# Patient Record
Sex: Female | Born: 2005 | Race: Black or African American | Hispanic: No | Marital: Single
Health system: Southern US, Community
[De-identification: ages and names within clinical notes are randomized; demographics above are authoritative.]

## PROBLEM LIST (undated history)

## (undated) DIAGNOSIS — F909 Attention-deficit hyperactivity disorder, unspecified type: Secondary | ICD-10-CM

---
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
## 2024-01-01 ENCOUNTER — Ambulatory Visit (HOSPITAL_COMMUNITY)
Admission: RE | Admit: 2024-01-01 | Discharge: 2024-01-01 | Disposition: A | Discharge status: Home / Self Care | Source: Ambulatory Visit | Attending: Obstetrics and Gynecology | Admitting: Obstetrics and Gynecology

## 2024-01-01 DIAGNOSIS — N914 Secondary oligomenorrhea: Secondary | ICD-10-CM | POA: Diagnosis present

## 2024-01-07 ENCOUNTER — Ambulatory Visit: Payer: Self-pay | Admitting: Obstetrics and Gynecology

## 2024-01-10 NOTE — Telephone Encounter (Addendum)
 Called patient regarding recent US  results. Patient OK to have mom on call during results. Notified patient per physician, US  showed normal uterus and normal lining. Advised patient to take medication Provera if menses has not started. Patient reports having cycles every other month which was discussed during visit, if menses is spaced further out than every other month, patient can take provera medication to help menses. Patient reports LMP 12/02/23 and is suppose to have next cycle in 01/2024. Notify patient medication will be sent to pharmacy for patient if cycle does not occur in July. Preferred pharmacy  [facility] on [address], RN  ----- Message from Carter Quarry sent at 01/07/2024  2:17 PM EDT ----- Notify that ultrasound shows normal uterus with normal lining and ovaries. If menses have not returned, can use provera to help start it ----- Message ----- From: Interface, Rad Results In Sent: 01/01/2024   2:25 PM EDT To: Carter Quarry, MD

## 2024-02-23 DIAGNOSIS — L03031 Cellulitis of right toe: Secondary | ICD-10-CM | POA: Insufficient documentation

## 2024-02-29 ENCOUNTER — Ambulatory Visit (INDEPENDENT_AMBULATORY_CARE_PROVIDER_SITE_OTHER): Payer: MEDICAID | Admitting: Podiatry

## 2024-02-29 DIAGNOSIS — L600 Ingrowing nail: Secondary | ICD-10-CM

## 2024-02-29 MED ORDER — NEOMYCIN-POLYMYXIN-HC 3.5-10000-1 OT SUSP
OTIC | 0 refills | Status: AC
Start: 2024-02-29 — End: ?

## 2024-02-29 NOTE — Patient Instructions (Signed)

## 2024-03-02 NOTE — Progress Notes (Signed)
  Subjective:  Patient ID: Kathleen Yang, female    DOB: 13-Oct-2005,  MRN: [id number]  Chief Complaint  Patient presents with   Ingrown Toenail    new patient-infected ingrown right hallux, lateral border, not diabetic-Christan Black, NP refer - started antibiotics on 8/6 Cephalexin    18 y.o. female presents with the above complaint. History confirmed with patient.  Her grandmother is here as well history  Objective:  Physical Exam: warm, good capillary refill, no trophic changes or ulcerative lesions, normal DP and PT pulses, normal sensory exam, and MRI right hallux lateral border slight paronychia.  Assessment:   1. Ingrowing right great toenail      Plan:  Patient was evaluated and treated and all questions answered.    Ingrown Nail, right -Patient elects to proceed with minor surgery to remove ingrown toenail today. Consent reviewed and signed by patient. -Ingrown nail excised. See procedure note. -Educated on post-procedure care including soaking. Written instructions provided and reviewed. -Rx for Cortisporin sent to pharmacy.  Should complete cephalexin course as well -Advised on signs and symptoms of infection developing.  We discussed that the phenol likely will create some redness and edema and tenderness around the nailbed as long as it is localized this is to be expected.  Will return as needed if any infection signs develop  Procedure: Excision of Ingrown Toenail Location: Right 1st toe lateral nail borders. Anesthesia: Lidocaine 1% plain; 1.5 mL and Marcaine 0.5% plain; 1.5 mL, digital block. Skin Prep: Betadine. Dressing: Silvadene; telfa; dry, sterile, compression dressing. Technique: Following skin prep, the toe was exsanguinated and a tourniquet was secured at the base of the toe. The affected nail border was freed, split with a nail splitter, and excised. Chemical matrixectomy was then performed with phenol and irrigated out with alcohol. The tourniquet  was then removed and sterile dressing applied. Disposition: Patient tolerated procedure well.    No follow-ups on file.
